# Patient Record
Sex: Female | Born: 1975 | Hispanic: No | Marital: Single | Smoking: Never smoker
Health system: Southern US, Community
[De-identification: ages and names within clinical notes are randomized; demographics above are authoritative.]

## PROBLEM LIST (undated history)

## (undated) DIAGNOSIS — I2699 Other pulmonary embolism without acute cor pulmonale: Secondary | ICD-10-CM

## (undated) DIAGNOSIS — F32A Depression, unspecified: Secondary | ICD-10-CM

## (undated) DIAGNOSIS — F419 Anxiety disorder, unspecified: Secondary | ICD-10-CM

## (undated) DIAGNOSIS — D649 Anemia, unspecified: Secondary | ICD-10-CM

## (undated) DIAGNOSIS — E785 Hyperlipidemia, unspecified: Secondary | ICD-10-CM

## (undated) DIAGNOSIS — F329 Major depressive disorder, single episode, unspecified: Secondary | ICD-10-CM

## (undated) DIAGNOSIS — G43909 Migraine, unspecified, not intractable, without status migrainosus: Secondary | ICD-10-CM

## (undated) HISTORY — PX: DILATION AND CURETTAGE OF UTERUS: SHX78

## (undated) HISTORY — PX: MENISCUS REPAIR: SHX5179

---

## 2014-06-26 ENCOUNTER — Emergency Department (HOSPITAL_BASED_OUTPATIENT_CLINIC_OR_DEPARTMENT_OTHER)
Admission: EM | Admit: 2014-06-26 | Discharge: 2014-06-26 | Disposition: A | Discharge status: Home / Self Care | Payer: Medicaid Other | Attending: Emergency Medicine | Admitting: Emergency Medicine

## 2014-06-26 ENCOUNTER — Encounter (HOSPITAL_BASED_OUTPATIENT_CLINIC_OR_DEPARTMENT_OTHER): Payer: Self-pay

## 2014-06-26 DIAGNOSIS — Z8639 Personal history of other endocrine, nutritional and metabolic disease: Secondary | ICD-10-CM | POA: Insufficient documentation

## 2014-06-26 DIAGNOSIS — O9989 Other specified diseases and conditions complicating pregnancy, childbirth and the puerperium: Secondary | ICD-10-CM | POA: Insufficient documentation

## 2014-06-26 DIAGNOSIS — Z862 Personal history of diseases of the blood and blood-forming organs and certain disorders involving the immune mechanism: Secondary | ICD-10-CM | POA: Diagnosis not present

## 2014-06-26 DIAGNOSIS — R519 Headache, unspecified: Secondary | ICD-10-CM

## 2014-06-26 DIAGNOSIS — Z8679 Personal history of other diseases of the circulatory system: Secondary | ICD-10-CM | POA: Insufficient documentation

## 2014-06-26 DIAGNOSIS — Z8659 Personal history of other mental and behavioral disorders: Secondary | ICD-10-CM | POA: Diagnosis not present

## 2014-06-26 DIAGNOSIS — Z3A17 17 weeks gestation of pregnancy: Secondary | ICD-10-CM | POA: Diagnosis not present

## 2014-06-26 DIAGNOSIS — R51 Headache: Secondary | ICD-10-CM | POA: Insufficient documentation

## 2014-06-26 HISTORY — DX: Hyperlipidemia, unspecified: E78.5

## 2014-06-26 HISTORY — DX: Major depressive disorder, single episode, unspecified: F32.9

## 2014-06-26 HISTORY — DX: Migraine, unspecified, not intractable, without status migrainosus: G43.909

## 2014-06-26 HISTORY — DX: Depression, unspecified: F32.A

## 2014-06-26 HISTORY — DX: Anemia, unspecified: D64.9

## 2014-06-26 HISTORY — DX: Anxiety disorder, unspecified: F41.9

## 2014-06-26 MED ORDER — DIPHENHYDRAMINE HCL 50 MG/ML IJ SOLN
12.5000 mg | Freq: Once | INTRAMUSCULAR | Status: AC
  Administered 2014-06-26: 12.5 mg via INTRAVENOUS
  Filled 2014-06-26: qty 1

## 2014-06-26 MED ORDER — METOCLOPRAMIDE HCL 5 MG/ML IJ SOLN
10.0000 mg | Freq: Once | INTRAMUSCULAR | Status: AC
  Administered 2014-06-26: 10 mg via INTRAVENOUS
  Filled 2014-06-26: qty 2

## 2014-06-26 NOTE — Discharge Instructions (Signed)

## 2014-06-26 NOTE — ED Notes (Signed)
MD at bedside. 

## 2014-06-26 NOTE — ED Provider Notes (Signed)
CSN: [id number]     Arrival date & time 06/26/14  0732 History   First MD Initiated Contact with Patient 06/26/14 [phone]     Chief Complaint  Patient presents with  . Headache   Patient is a 39 y.o. female presenting with headaches.  Headache Pain location:  R parietal and frontal Quality:  Sharp Radiates to:  Does not radiate Onset quality:  Gradual Duration:  4 days Timing:  Constant Chronicity:  Recurrent Context: not exposure to bright light and not loud noise   Relieved by:  Nothing Ineffective treatments: Fioricet. Associated symptoms: no blurred vision, no cough, no diarrhea, no fever, no neck pain, no neck stiffness and no URI    the patient is [redacted] weeks pregnant. She is not having any complications with her pregnancy. She saw her OB doctor. She was given a prescription for Fioricet. She was told by her OB doctor that she may need to see a neurologist. She was told if symptoms did not get any better to go the emergency room. Patient went to [facility] and was waiting to be seen. Due to the long wait time she decided to come here. The pain is sharp and also located in her right ear. She feels like there is some numbness on the right side of her face. She has not had any issues with her strength or speech. She is not having any difficulty walking. No visual disturbances.  Past Medical History  Diagnosis Date  . Hyperlipidemia   . Anemia   . Anxiety   . Depression   . Migraine    History reviewed. No pertinent past surgical history. No family history on file. History  Substance Use Topics  . Smoking status: Never Smoker   . Smokeless tobacco: Not on file  . Alcohol Use: No   OB History    Gravida Para Term Preterm AB TAB SAB Ectopic Multiple Living   1              Review of Systems  Constitutional: Negative for fever.  Eyes: Negative for blurred vision.  Respiratory: Negative for cough.   Gastrointestinal: Negative for diarrhea.  Musculoskeletal:  Negative for neck pain and neck stiffness.  Neurological: Positive for headaches.  All other systems reviewed and are negative.     Allergies  Review of patient's allergies indicates not on file.  Home Medications   Prior to Admission medications   Not on File   BP 112/73 mmHg  Pulse 94  Temp(Src) 98.6 F (37 C) (Oral)  Resp 18  Ht  (1.626 m)  Wt 212 lb (96.163 kg)  BMI 36.37 kg/m2  SpO2 100% Physical Exam  Constitutional: She is oriented to person, place, and time. She appears well-developed and well-nourished. No distress.  HENT:  Head: Macrocephalic. Head is with abrasion.  Right Ear: Tympanic membrane and external ear normal.  Left Ear: Tympanic membrane and external ear normal.  Mouth/Throat: Oropharynx is clear and moist and mucous membranes are normal. No oral lesions. No dental abscesses or uvula swelling. No posterior oropharyngeal edema.  Eyes: Conjunctivae are normal. Right eye exhibits no discharge. Left eye exhibits no discharge. No scleral icterus.  Neck: Neck supple. No tracheal deviation present.  Cardiovascular: Normal rate, regular rhythm and intact distal pulses.   Pulmonary/Chest: Effort normal and breath sounds normal. No stridor. No respiratory distress. She has no wheezes. She has no rales.  Abdominal: Soft. Bowel sounds are normal. She exhibits no distension.  There is no tenderness. There is no rebound and no guarding.  Musculoskeletal: She exhibits no edema or tenderness.  Neurological: She is alert and oriented to person, place, and time. She has normal strength. No cranial nerve deficit (No facial droop, extraocular movements intact, tongue midline ) or sensory deficit. She exhibits normal muscle tone. She displays no seizure activity. Coordination normal.  5 out of 5 strength bilateral upper extremities and lower extremities, sensation intact in all extremities, no visual field cuts, no left or right sided neglect,  no nystagmus noted   Skin:  Skin is warm and dry. No rash noted.  Psychiatric: She has a normal mood and affect.  Nursing note and vitals reviewed.   ED Course  Procedures (including critical care time) Medications  metoCLOPramide (REGLAN) injection 10 mg (10 mg Intravenous Given 06/26/14 0807)  diphenhydrAMINE (BENADRYL) injection 12.5 mg (12.5 mg Intravenous Given 06/26/14 0807)     MDM   Final diagnoses:  Acute intractable headache, unspecified headache type    Patient send her OB doctor thought she might end up needing scan. I have a low suspicion for acute hemorrhage, stroke, or brain tumor based on her findings. I do not feel that a CT scan of the brain is indicated at this time. Patient does have history of migraines. It is possible this is an atypical migraine type headache. We will try a dose of Reglan. I do think it is safe for her to follow up with a neurologist if her symptoms persist  While still in the ED, pt states she got a call from the neurologist and she can be seen in the office by them now.  Will dc home.    Linwood DibblesJon Nikolas Casher, MD 06/26/14 [phone]

## 2014-06-26 NOTE — ED Notes (Signed)
Pt c/o pain in headache since Friday. Pt is [redacted] weeks pregnant. Also reports sharp pain in right ear.

## 2015-12-13 ENCOUNTER — Emergency Department (HOSPITAL_BASED_OUTPATIENT_CLINIC_OR_DEPARTMENT_OTHER)
Admission: EM | Admit: 2015-12-13 | Discharge: 2015-12-13 | Disposition: A | Discharge status: Home / Self Care | Payer: Medicaid Other | Attending: Emergency Medicine | Admitting: Emergency Medicine

## 2015-12-13 ENCOUNTER — Encounter (HOSPITAL_BASED_OUTPATIENT_CLINIC_OR_DEPARTMENT_OTHER): Payer: Self-pay

## 2015-12-13 DIAGNOSIS — Z79899 Other long term (current) drug therapy: Secondary | ICD-10-CM | POA: Insufficient documentation

## 2015-12-13 DIAGNOSIS — R1310 Dysphagia, unspecified: Secondary | ICD-10-CM

## 2015-12-13 DIAGNOSIS — J029 Acute pharyngitis, unspecified: Secondary | ICD-10-CM | POA: Diagnosis present

## 2015-12-13 HISTORY — DX: Other pulmonary embolism without acute cor pulmonale: I26.99

## 2015-12-13 LAB — RAPID STREP SCREEN (MED CTR MEBANE ONLY): STREPTOCOCCUS, GROUP A SCREEN (DIRECT): NEGATIVE

## 2015-12-13 MED ORDER — MAGIC MOUTHWASH: 5.0000 mL | Freq: Three times a day (TID) | ORAL | 0 refills | Status: AC | PRN

## 2015-12-13 MED ORDER — GI COCKTAIL ~~LOC~~
30.0000 mL | Freq: Once | ORAL | Status: AC
  Administered 2015-12-13: 30 mL via ORAL
  Filled 2015-12-13: qty 30

## 2015-12-13 NOTE — ED Triage Notes (Signed)
Reports sore throat and pain with swallowing.  Reports had strep test yesterday which was negative.  Reports "feels like spasms when I swallow or eat anything".  No redness noted or englarged tonsils.

## 2015-12-13 NOTE — ED Provider Notes (Signed)
[facility]   CSN: [id number] Arrival date & time: 12/13/15  1007     History   Chief Complaint Chief Complaint  Patient presents with  . Sore Throat    HPI Lindsey Li is a 40 y.o. female.  The history is provided by the patient. No language interpreter was used.  Sore Throat     Lindsey Li is a 40 y.o. female who presents to the Emergency Department complaining of sore throat.  She reports 2 days of sore throat. Pain is throughout her entire throat but greatest on the right side. She has no associated cramping and muscle spasm type sensation when she tries to swallow solids has been unable to swallow solids. She is able to drink liquids but they cause her pain to worsen. She has multiple sick contacts with strep throat. She had an unofficial strep test performed yesterday that was negative. She denies any fevers, shortness of breath, vomiting. She does have a mild cough.  Past Medical History:  Diagnosis Date  . Anemia   . Anxiety   . Depression   . Hyperlipidemia   . Migraine   . PE (pulmonary embolism)     There are no active problems to display for this patient.   Past Surgical History:  Procedure Laterality Date  . CESAREAN SECTION    . DILATION AND CURETTAGE OF UTERUS    . MENISCUS REPAIR Left     OB History    Gravida Para Term Preterm AB Living   1             SAB TAB Ectopic Multiple Live Births                   Home Medications    Prior to Admission medications   Medication Sig Start Date End Date Taking? Authorizing Provider  Atorvastatin Calcium (LIPITOR PO) Take by mouth.   Yes Historical Provider, MD  BuPROPion HCl (WELLBUTRIN XL PO) Take by mouth.   Yes Historical Provider, MD  LOSARTAN POTASSIUM PO Take by mouth.   Yes Historical Provider, MD  MetyraPONE (METOPIRONE PO) Take by mouth.   Yes Historical Provider, MD  Nutritional Supplements (VITAMIN D BOOSTER PO) Take by mouth.   Yes Historical Provider, MD    Rivaroxaban (XARELTO PO) Take by mouth.   Yes Historical Provider, MD  sertraline (ZOLOFT) 25 MG tablet Take 25 mg by mouth daily.   Yes Historical Provider, MD  magic mouthwash SOLN Take 5 mLs by mouth 3 (three) times daily as needed for mouth pain. 12/13/15   Tilden FossaElizabeth Jalayah Gutridge, MD    Family History No family history on file.  Social History Social History  Substance Use Topics  . Smoking status: Never Smoker  . Smokeless tobacco: Never Used  . Alcohol use No     Allergies   Levaquin [levofloxacin]; Morphine and related; Naproxen; Ultram [tramadol]; and Valium [diazepam]   Review of Systems Review of Systems  All other systems reviewed and are negative.    Physical Exam Updated Vital Signs BP 117/80 (BP Location: Right Arm)   Pulse 76   Temp 98.3 F (36.8 C) (Oral)   Resp 18   Ht 5\' 4"  (1.626 m)   Wt 223 lb (101.2 kg)   LMP  (LMP Unknown)   SpO2 98%   Breastfeeding? Unknown   BMI 38.28 kg/m   Physical Exam  Constitutional: She is oriented to person, place, and time. She appears well-developed and well-nourished.  No distress.  HENT:  Head: Normocephalic and atraumatic.  Right Ear: External ear normal.  Left Ear: External ear normal.  No edema in posterior erythema.  One pinpoint exudative area in right tonsillar pillar with minimal erythema.    Neck: Neck supple.  Cardiovascular: Normal rate and regular rhythm.   No murmur heard. Pulmonary/Chest: Effort normal and breath sounds normal. No stridor. No respiratory distress.  Musculoskeletal: She exhibits no edema or tenderness.  Lymphadenopathy:    She has no cervical adenopathy.  Neurological: She is alert and oriented to person, place, and time.  Skin: Skin is warm and dry.  Psychiatric: She has a normal mood and affect. Her behavior is normal.  Nursing note and vitals reviewed.    ED Treatments / Results  Labs (all labs ordered are listed, but only abnormal results are displayed) Labs Reviewed  RAPID  STREP SCREEN (NOT AT [facility])  CULTURE, GROUP A STREP [facility])    EKG  EKG Interpretation None       Radiology No results found.  Procedures Procedures (including critical care time)  Medications Ordered in ED Medications  gi cocktail (Maalox,Lidocaine,Donnatal) (30 mLs Oral Given 12/13/15 1100)     Initial Impression / Assessment and Plan / ED Course  I have reviewed the triage vital signs and the nursing notes.  Pertinent labs & imaging results that were available during my care of the patient were reviewed by me and considered in my medical decision making (see chart for details).  Clinical Course    Pt here for evaluation of sore throat for two days.  She is well hydrated and nontoxic appearing in the ED with no respiratory distress.  No evidence of obstruction or acute infectious process based on exam.  Discussed with pt importance of PCP/GI follow up, home care, return precautions.    Final Clinical Impressions(s) / ED Diagnoses   Final diagnoses:  Odynophagia    New Prescriptions Discharge Medication List as of 12/13/2015 11:48 AM    START taking these medications   Details  magic mouthwash SOLN Take 5 mLs by mouth 3 (three) times daily as needed for mouth pain., Starting Fri 12/13/2015, Print         Tilden Fossa, MD 12/14/15 1339

## 2015-12-16 LAB — CULTURE, GROUP A STREP (THRC)

## 2020-09-07 ENCOUNTER — Emergency Department (HOSPITAL_BASED_OUTPATIENT_CLINIC_OR_DEPARTMENT_OTHER)
Admission: EM | Admit: 2020-09-07 | Discharge: 2020-09-07 | Disposition: A | Discharge status: Home / Self Care | Payer: Medicaid Other | Attending: Emergency Medicine | Admitting: Emergency Medicine

## 2020-09-07 ENCOUNTER — Emergency Department (HOSPITAL_BASED_OUTPATIENT_CLINIC_OR_DEPARTMENT_OTHER): Payer: Medicaid Other

## 2020-09-07 ENCOUNTER — Other Ambulatory Visit: Payer: Self-pay

## 2020-09-07 ENCOUNTER — Encounter (HOSPITAL_BASED_OUTPATIENT_CLINIC_OR_DEPARTMENT_OTHER): Payer: Self-pay

## 2020-09-07 DIAGNOSIS — S0990XA Unspecified injury of head, initial encounter: Secondary | ICD-10-CM

## 2020-09-07 DIAGNOSIS — W19XXXA Unspecified fall, initial encounter: Secondary | ICD-10-CM

## 2020-09-07 DIAGNOSIS — M25561 Pain in right knee: Secondary | ICD-10-CM | POA: Diagnosis not present

## 2020-09-07 DIAGNOSIS — S6991XA Unspecified injury of right wrist, hand and finger(s), initial encounter: Secondary | ICD-10-CM | POA: Diagnosis present

## 2020-09-07 DIAGNOSIS — M25562 Pain in left knee: Secondary | ICD-10-CM | POA: Diagnosis not present

## 2020-09-07 DIAGNOSIS — S63501A Unspecified sprain of right wrist, initial encounter: Secondary | ICD-10-CM

## 2020-09-07 DIAGNOSIS — M25511 Pain in right shoulder: Secondary | ICD-10-CM

## 2020-09-07 DIAGNOSIS — Y9351 Activity, roller skating (inline) and skateboarding: Secondary | ICD-10-CM | POA: Diagnosis not present

## 2020-09-07 DIAGNOSIS — M25571 Pain in right ankle and joints of right foot: Secondary | ICD-10-CM

## 2020-09-07 MED ORDER — ACETAMINOPHEN 500 MG PO TABS
1000.0000 mg | ORAL_TABLET | Freq: Once | ORAL | Status: AC
  Administered 2020-09-07: 1000 mg via ORAL
  Filled 2020-09-07: qty 2

## 2020-09-07 MED ORDER — METHOCARBAMOL 500 MG PO TABS: 500.0000 mg | ORAL_TABLET | Freq: Two times a day (BID) | ORAL | 0 refills | Status: AC
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
## 2020-09-07 NOTE — ED Notes (Signed)
Patient transported to CT 

## 2020-09-07 NOTE — Discharge Instructions (Addendum)
Your work-up today was reassuring there were no fractures.  If you continue to have knee pain you may follow-up with [facility] for evaluation.  Your CT scan of your head was without any intracranial hemorrhage.  Please take Tylenol and ibuprofen as discussed below.  I also prescribed a muscle relaxer for pain.  Drink plenty of water.  I also written you a work note for the next couple days.  Please use Tylenol or ibuprofen for pain.  You may use 600 mg ibuprofen every 6 hours or 1000 mg of Tylenol every 6 hours.  You may choose to alternate between the 2.  This would be most effective.  Not to exceed 4 g of Tylenol within 24 hours.  Not to exceed 3200 mg ibuprofen 24 hours.
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
## 2021-11-22 IMAGING — DX DG KNEE COMPLETE 4+V*R*
4 series · 4 of 4 positions shown · non-contrast
Comparison: None.

CLINICAL DATA: Right knee pain

EXAM:
RIGHT KNEE - COMPLETE 4+ VIEW

[knee ap]
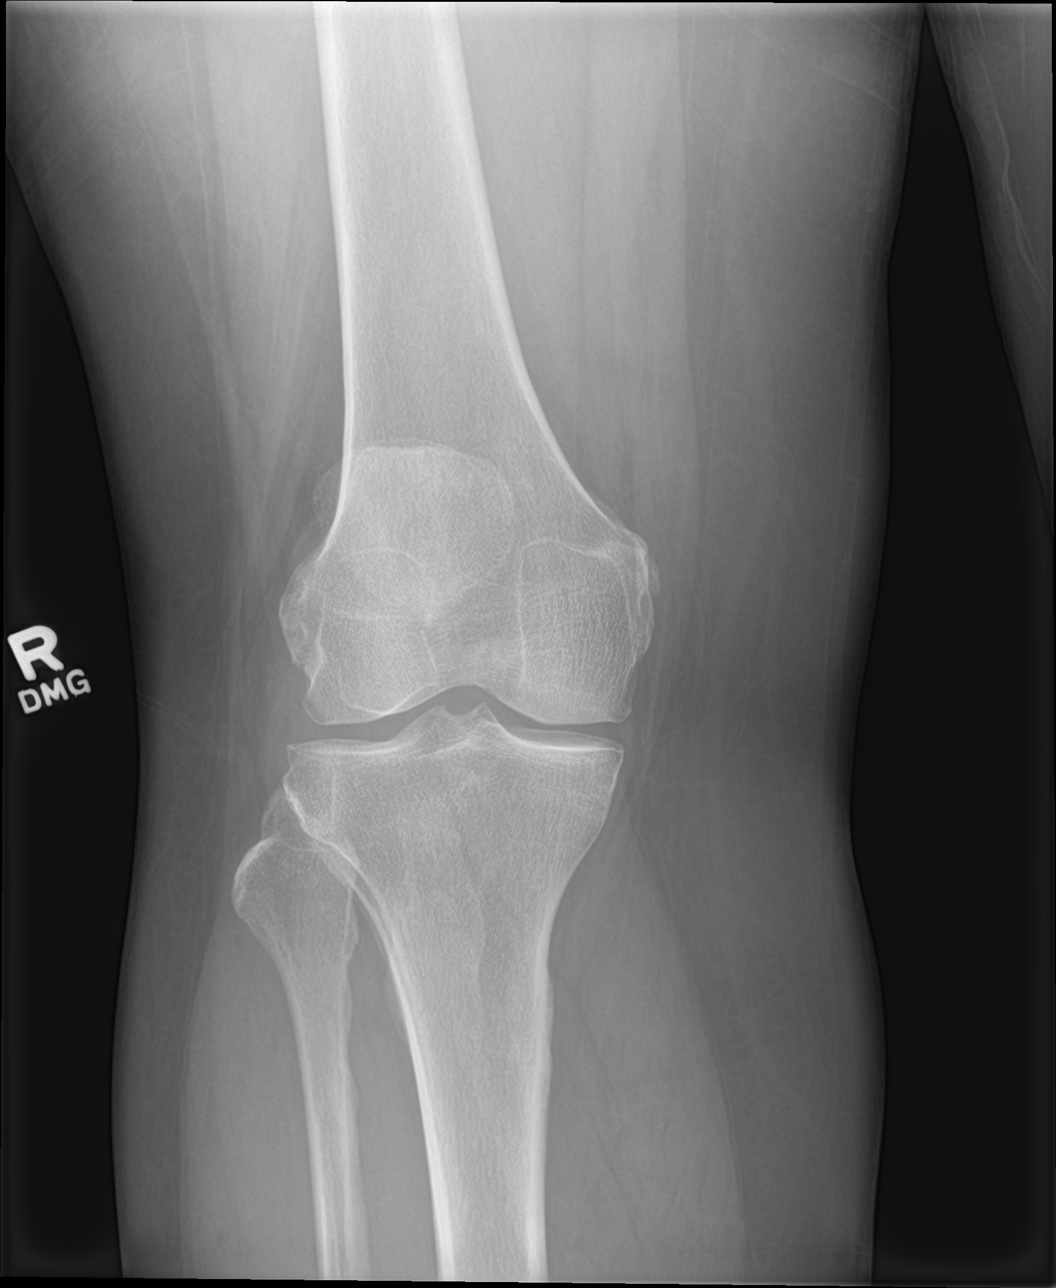

[tunnel]
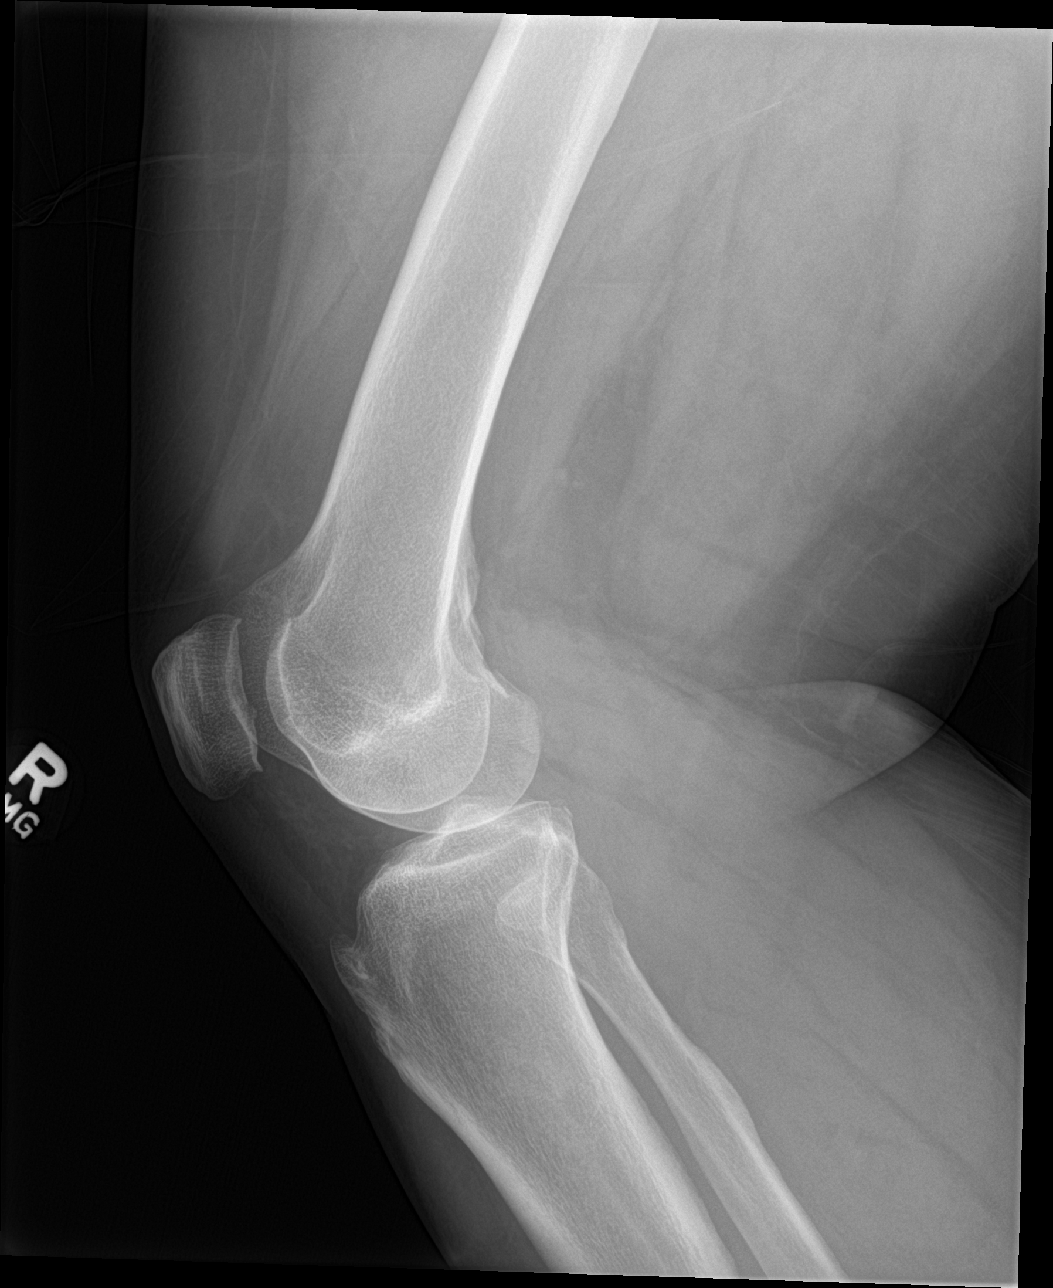

[knee obl (1 of 2)]
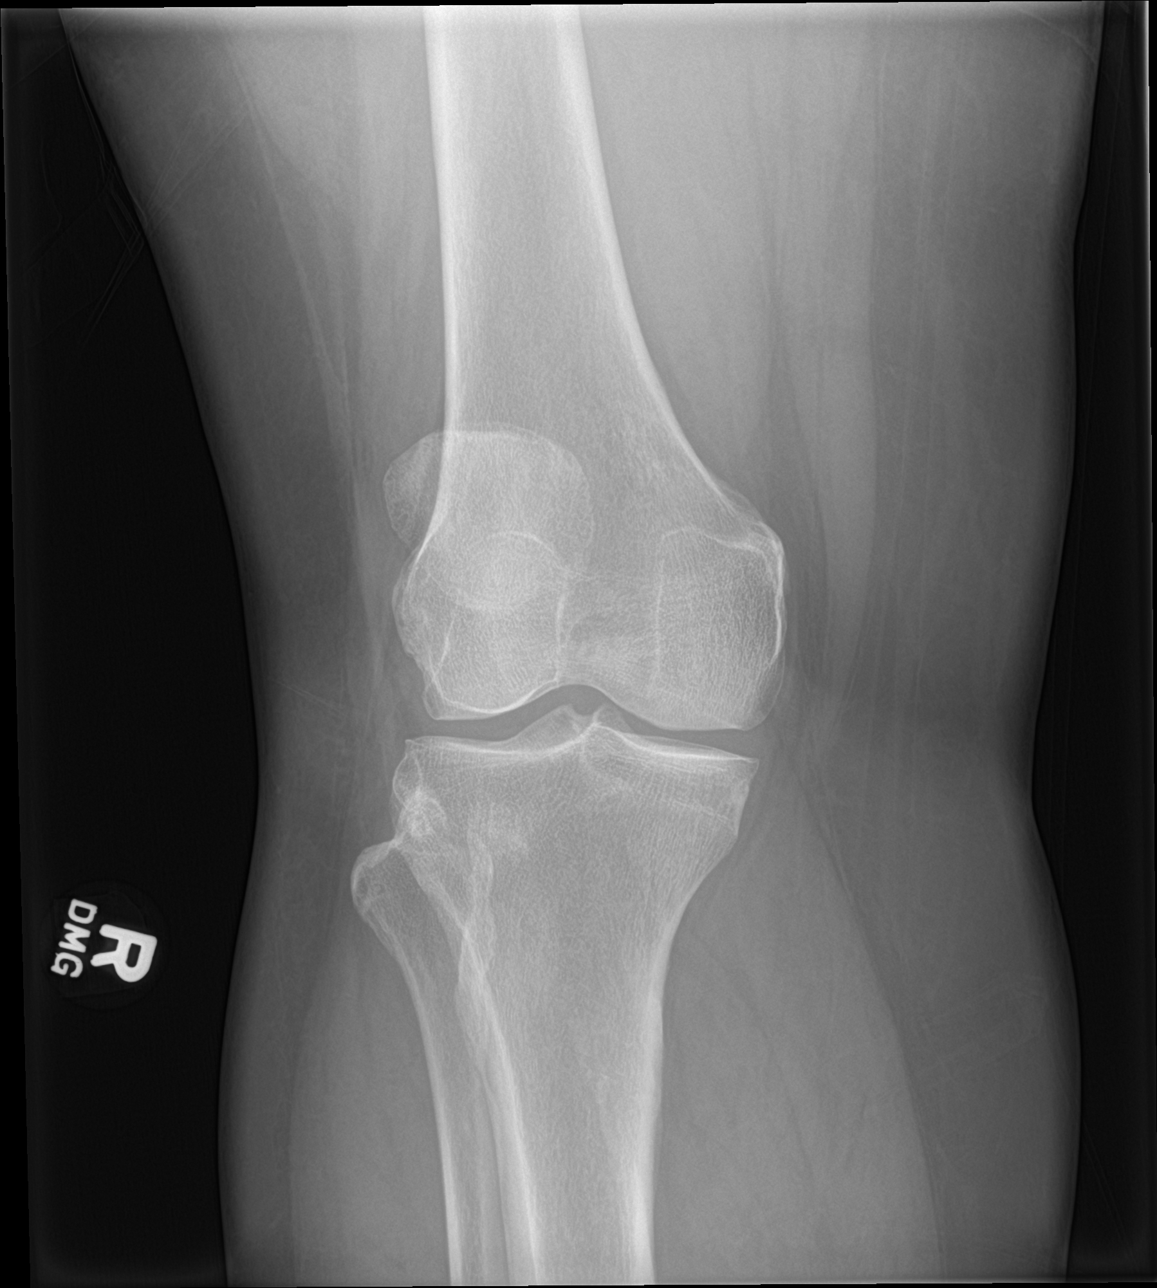

[knee obl (2 of 2)]
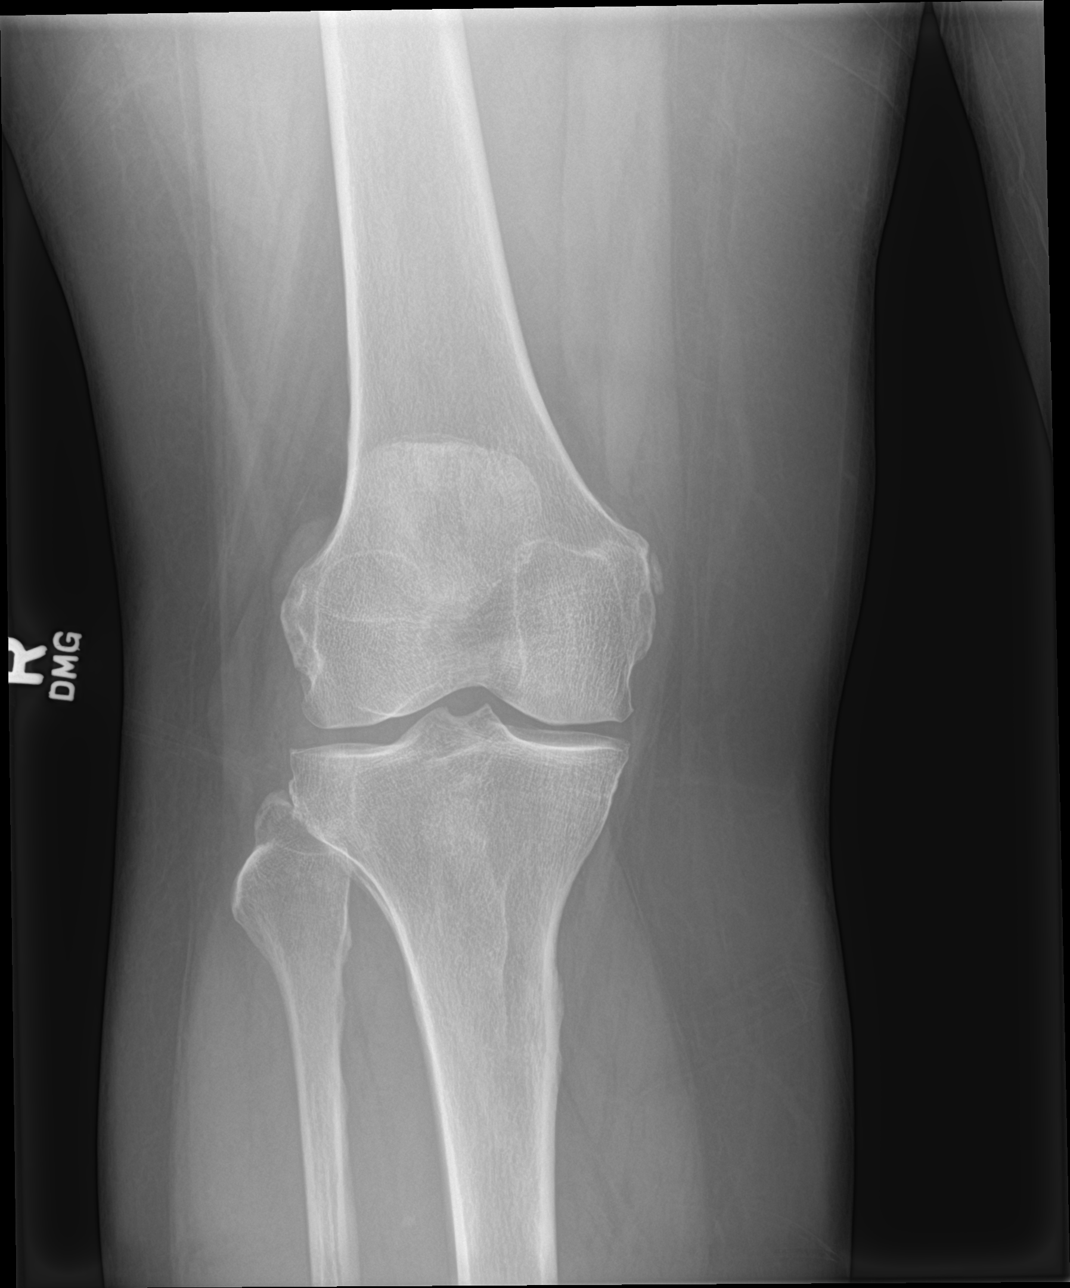

[4 of 4 positions shown; findings below may reference images not displayed]

FINDINGS: No evidence of fracture, dislocation, or joint effusion. Joint
spaces are preserved with tiny marginal osteophytes. Soft tissues
are unremarkable.
IMPRESSION: No acute osseous abnormality, right knee.

## 2023-05-29 ENCOUNTER — Emergency Department (HOSPITAL_BASED_OUTPATIENT_CLINIC_OR_DEPARTMENT_OTHER)
Admission: EM | Admit: 2023-05-29 | Discharge: 2023-05-29 | Disposition: A | Discharge status: Home / Self Care | Attending: Emergency Medicine | Admitting: Emergency Medicine

## 2023-05-29 ENCOUNTER — Encounter (HOSPITAL_BASED_OUTPATIENT_CLINIC_OR_DEPARTMENT_OTHER): Payer: Self-pay | Admitting: Emergency Medicine

## 2023-05-29 ENCOUNTER — Other Ambulatory Visit: Payer: Self-pay

## 2023-05-29 ENCOUNTER — Emergency Department (HOSPITAL_BASED_OUTPATIENT_CLINIC_OR_DEPARTMENT_OTHER)

## 2023-05-29 DIAGNOSIS — R0981 Nasal congestion: Secondary | ICD-10-CM | POA: Diagnosis not present

## 2023-05-29 DIAGNOSIS — R0602 Shortness of breath: Secondary | ICD-10-CM | POA: Diagnosis not present

## 2023-05-29 DIAGNOSIS — R0600 Dyspnea, unspecified: Secondary | ICD-10-CM | POA: Diagnosis not present

## 2023-05-29 DIAGNOSIS — R112 Nausea with vomiting, unspecified: Secondary | ICD-10-CM | POA: Diagnosis present

## 2023-05-29 DIAGNOSIS — R059 Cough, unspecified: Secondary | ICD-10-CM | POA: Diagnosis not present

## 2023-05-29 DIAGNOSIS — Z86711 Personal history of pulmonary embolism: Secondary | ICD-10-CM | POA: Insufficient documentation

## 2023-05-29 DIAGNOSIS — R109 Unspecified abdominal pain: Secondary | ICD-10-CM | POA: Diagnosis not present

## 2023-05-29 DIAGNOSIS — Z7901 Long term (current) use of anticoagulants: Secondary | ICD-10-CM | POA: Insufficient documentation

## 2023-05-29 LAB — CBC
HCT: 42.8 % (ref 36.0–46.0)
Hemoglobin: 13.8 g/dL (ref 12.0–15.0)
MCH: 25.5 pg — ABNORMAL LOW (ref 26.0–34.0)
MCHC: 32.2 g/dL (ref 30.0–36.0)
MCV: 79.1 fL — ABNORMAL LOW (ref 80.0–100.0)
Platelets: 395 10*3/uL (ref 150–400)
RBC: 5.41 MIL/uL — ABNORMAL HIGH (ref 3.87–5.11)
RDW: 15.9 % — ABNORMAL HIGH (ref 11.5–15.5)
WBC: 7.5 10*3/uL (ref 4.0–10.5)
nRBC: 0 % (ref 0.0–0.2)

## 2023-05-29 LAB — COMPREHENSIVE METABOLIC PANEL
ALT: 16 U/L (ref 0–44)
AST: 16 U/L (ref 15–41)
Albumin: 3.8 g/dL (ref 3.5–5.0)
Alkaline Phosphatase: 83 U/L (ref 38–126)
Anion gap: 10 (ref 5–15)
BUN: 11 mg/dL (ref 6–20)
CO2: 23 mmol/L (ref 22–32)
Calcium: 9 mg/dL (ref 8.9–10.3)
Chloride: 101 mmol/L (ref 98–111)
Creatinine, Ser: 1.06 mg/dL — ABNORMAL HIGH (ref 0.44–1.00)
GFR, Estimated: >60 mL/min (ref >60)
Glucose, Bld: 88 mg/dL (ref 70–99)
Potassium: 3.1 mmol/L — ABNORMAL LOW (ref 3.5–5.1)
Sodium: 134 mmol/L — ABNORMAL LOW (ref 135–145)
Total Bilirubin: 0.8 mg/dL (ref 0.0–1.2)
Total Protein: 8.5 g/dL — ABNORMAL HIGH (ref 6.5–8.1)

## 2023-05-29 LAB — URINALYSIS, ROUTINE W REFLEX MICROSCOPIC
Glucose, UA: NEGATIVE mg/dL
Hgb urine dipstick: NEGATIVE
Ketones, ur: 15 mg/dL — AB
Leukocytes,Ua: NEGATIVE
Nitrite: NEGATIVE
Protein, ur: 100 mg/dL — AB
Specific Gravity, Urine: >=1.03 (ref 1.005–1.030)
pH: 6 (ref 5.0–8.0)

## 2023-05-29 LAB — D-DIMER, QUANTITATIVE: D-Dimer, Quant: 0.33 ug{FEU}/mL (ref 0.00–0.50)

## 2023-05-29 LAB — URINALYSIS, MICROSCOPIC (REFLEX)

## 2023-05-29 LAB — MAGNESIUM: Magnesium: 2.2 mg/dL (ref 1.7–2.4)

## 2023-05-29 LAB — RESP PANEL BY RT-PCR (RSV, FLU A&B, COVID)  RVPGX2
Influenza A by PCR: NEGATIVE
Influenza B by PCR: NEGATIVE
Resp Syncytial Virus by PCR: NEGATIVE
SARS Coronavirus 2 by RT PCR: NEGATIVE

## 2023-05-29 LAB — HCG, QUANTITATIVE, PREGNANCY: hCG, Beta Chain, Quant, S: <1 m[IU]/mL (ref <5)

## 2023-05-29 LAB — TROPONIN I (HIGH SENSITIVITY): Troponin I (High Sensitivity): 3 ng/L (ref <18)

## 2023-05-29 LAB — LIPASE, BLOOD: Lipase: 28 U/L (ref 11–51)

## 2023-05-29 MED ORDER — FENTANYL CITRATE PF 50 MCG/ML IJ SOSY
12.5000 ug | PREFILLED_SYRINGE | Freq: Once | INTRAMUSCULAR | Status: AC
  Administered 2023-05-29: 12.5 ug via INTRAVENOUS
  Filled 2023-05-29: qty 1

## 2023-05-29 MED ORDER — PROMETHAZINE HCL 25 MG PO TABS: 25.0000 mg | ORAL_TABLET | Freq: Four times a day (QID) | ORAL | 0 refills | Status: AC | PRN

## 2023-05-29 MED ORDER — DIPHENHYDRAMINE HCL 50 MG/ML IJ SOLN
25.0000 mg | Freq: Once | INTRAMUSCULAR | Status: AC
  Administered 2023-05-29: 25 mg via INTRAVENOUS
  Filled 2023-05-29: qty 1

## 2023-05-29 MED ORDER — ONDANSETRON 4 MG PO TBDP: 4.0000 mg | ORAL_TABLET | Freq: Three times a day (TID) | ORAL | 0 refills | Status: AC | PRN

## 2023-05-29 MED ORDER — ONDANSETRON 4 MG PO TBDP: 4.0000 mg | ORAL_TABLET | Freq: Once | ORAL | Status: DC | PRN

## 2023-05-29 MED ORDER — DROPERIDOL 2.5 MG/ML IJ SOLN
1.2500 mg | Freq: Once | INTRAMUSCULAR | Status: AC
  Administered 2023-05-29: 1.25 mg via INTRAVENOUS
  Filled 2023-05-29: qty 2

## 2023-05-29 MED ORDER — IOHEXOL 300 MG/ML  SOLN
100.0000 mL | Freq: Once | INTRAMUSCULAR | Status: AC | PRN
  Administered 2023-05-29: 100 mL via INTRAVENOUS

## 2023-05-29 MED ORDER — ONDANSETRON HCL 4 MG/2ML IJ SOLN
4.0000 mg | Freq: Once | INTRAMUSCULAR | Status: AC
  Administered 2023-05-29: 4 mg via INTRAVENOUS
  Filled 2023-05-29: qty 2

## 2023-05-29 MED ORDER — ACETAMINOPHEN 500 MG PO TABS
1000.0000 mg | ORAL_TABLET | Freq: Once | ORAL | Status: DC
Start: 2023-05-29 — End: 2023-05-29
  Filled 2023-05-29: qty 2

## 2023-05-29 MED ORDER — POTASSIUM CHLORIDE CRYS ER 20 MEQ PO TBCR
40.0000 meq | EXTENDED_RELEASE_TABLET | Freq: Once | ORAL | Status: AC
  Administered 2023-05-29: 40 meq via ORAL
  Filled 2023-05-29: qty 2

## 2023-05-29 MED ORDER — METOCLOPRAMIDE HCL 5 MG/ML IJ SOLN
10.0000 mg | Freq: Once | INTRAMUSCULAR | Status: AC
  Administered 2023-05-29: 10 mg via INTRAVENOUS
  Filled 2023-05-29: qty 2

## 2023-05-29 MED ORDER — HYDRALAZINE HCL 20 MG/ML IJ SOLN
5.0000 mg | Freq: Once | INTRAMUSCULAR | Status: AC
  Administered 2023-05-29: 5 mg via INTRAVENOUS
  Filled 2023-05-29: qty 1

## 2023-05-29 MED ORDER — PROMETHAZINE HCL 25 MG RE SUPP: 25.0000 mg | Freq: Four times a day (QID) | RECTAL | 0 refills | Status: AC | PRN

## 2023-05-29 MED ORDER — SODIUM CHLORIDE 0.9 % IV BOLUS
1000.0000 mL | Freq: Once | INTRAVENOUS | Status: AC
  Administered 2023-05-29: 1000 mL via INTRAVENOUS
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
## 2023-05-29 NOTE — ED Provider Notes (Signed)
 [place] [facility]MENT AT [facility] Provider Note   CSN: [id number] Arrival date & time: 05/29/23  1345     History {Add pertinent medical, surgical, social history, OB history to HPI:1} Chief Complaint  Patient presents with   Nausea   Shortness of Breath   Palpitations    Lindsey Li is a 48 y.o. female.   Shortness of Breath Palpitations Associated symptoms: shortness of breath     48 year old female presents emergency department with an emergency complaints.  Reports left-sided abdominal pain with nausea and vomiting.  States that she has been having symptoms for the past 4 days.  Also has felt more short of breath ever since symptom onset.  States that she has not had much chest pain but has felt short of breath with walking.  Also states that when she is vomiting, feels short of breath.  States she has had some accompanied nasal congestion as well as cough.  Denies any fevers, chills, urinary symptoms, change in bowel habits.  Denies any hematemesis.  Was seen at the urgent care and prescribed Phenergan without improvement of symptoms prompting visit to the emergency department.  Past medical history significant for pulmonary embolism on Xarelto, CVA, TBI, depression  Home Medications Prior to Admission medications   Medication Sig Start Date End Date Taking? Authorizing Provider  Atorvastatin Calcium (LIPITOR PO) Take by mouth.    [provider]  BuPROPion HCl (WELLBUTRIN XL PO) Take by mouth.    [provider]  LOSARTAN POTASSIUM PO Take by mouth.    [provider]  magic mouthwash SOLN Take 5 mLs by mouth 3 (three) times daily as needed for mouth pain. 12/13/15   Tilden Fossa, MD  methocarbamol (ROBAXIN) 500 MG tablet Take 1 tablet (500 mg total) by mouth 2 (two) times daily. 09/07/20   Gailen Shelter, PA  MetyraPONE (METOPIRONE PO) Take by mouth.    [provider]  Nutritional Supplements (VITAMIN D  BOOSTER PO) Take by mouth.    [provider]  Rivaroxaban (XARELTO PO) Take by mouth.    [provider]  sertraline (ZOLOFT) 25 MG tablet Take 25 mg by mouth daily.    [provider]      Allergies    Levaquin [levofloxacin], Lisinopril, Morphine and codeine, Naproxen, Ultram [tramadol], and Valium [diazepam]    Review of Systems   Review of Systems  Respiratory:  Positive for shortness of breath.   Cardiovascular:  Positive for palpitations.    Physical Exam Updated Vital Signs BP (!) 151/116 (BP Location: Right Arm)   Pulse (!) 123   Temp 98.3 F (36.8 C) (Oral)   Resp 18   Ht 5\' 4"  (1.626 m)   Wt 91.6 kg   SpO2 96%   BMI 34.67 kg/m  Physical Exam Vitals and nursing note reviewed.  Constitutional:      General: She is not in acute distress.    Appearance: She is well-developed.  HENT:     Head: Normocephalic and atraumatic.  Eyes:     Conjunctiva/sclera: Conjunctivae normal.  Cardiovascular:     Rate and Rhythm: Normal rate and regular rhythm.     Heart sounds: No murmur heard. Pulmonary:     Effort: Pulmonary effort is normal. No respiratory distress.     Breath sounds: Normal breath sounds. No wheezing, rhonchi or rales.  Abdominal:     Palpations: Abdomen is soft.     Tenderness: There is abdominal  tenderness.     Comments: Left lower quadrant/left upper abdominal tenderness.  Musculoskeletal:        General: No swelling.     Cervical back: Neck supple.     Right lower leg: No edema.     Left lower leg: No edema.  Skin:    General: Skin is warm and dry.     Capillary Refill: Capillary refill takes less than 2 seconds.  Neurological:     Mental Status: She is alert.  Psychiatric:        Mood and Affect: Mood normal.     ED Results / Procedures / Treatments   Labs (all labs ordered are listed, but only abnormal results are displayed) Labs Reviewed  RESP PANEL BY RT-PCR (RSV, FLU A&B, COVID)  RVPGX2  LIPASE, BLOOD   COMPREHENSIVE METABOLIC PANEL  CBC  URINALYSIS, ROUTINE W REFLEX MICROSCOPIC  MAGNESIUM  HCG, QUANTITATIVE, PREGNANCY    EKG EKG Interpretation Date/Time:  Saturday May 29 2023 13:54:10 EST Ventricular Rate:  118 PR Interval:  152 QRS Duration:  70 QT Interval:  328 QTC Calculation: 460 R Axis:   -49  Text Interpretation: Sinus tachycardia Probable left atrial enlargement Abnormal R-wave progression, early transition Probable left ventricular hypertrophy Anterior Q waves, possibly due to LVH No old tracing to compare Confirmed by Linwood Dibbles [phone]) on 05/29/2023 1:59:16 PM  Radiology No results found.  Procedures Procedures  {Document cardiac monitor, telemetry assessment procedure when appropriate:1}  Medications Ordered in ED Medications  ondansetron (ZOFRAN) injection 4 mg (has no administration in time range)  sodium chloride 0.9 % bolus 1,000 mL (has no administration in time range)    ED Course/ Medical Decision Making/ A&P   {   Click here for ABCD2, HEART and other calculatorsREFRESH Note before signing :1}                              Medical Decision Making Amount and/or Complexity of Data Reviewed Labs: ordered. Radiology: ordered.  Risk OTC drugs. Prescription drug management.   This patient presents to the ED for concern of abdominal pain, shortness of breath, this involves an extensive number of treatment options, and is a complaint that carries with it a high risk of complications and morbidity.  The differential diagnosis includes viral gastroenteritis, gastritis, PUD, cholecystitis, CBD pathology, SBO/LBO, volvulus, diverticulitis, discitis, anemia, ACS, PE, pneumonia, pneumothorax, other   Co morbidities that complicate the patient evaluation  See HPI   Additional history obtained:  Additional history obtained from EMR External records from outside source obtained and reviewed including see above   Lab Tests:  I Ordered, and  personally interpreted labs.  The pertinent results include: No leukocytosis.  No evidence of anemia.  Placed within range.  Mild hyponatremia, hypokalemia 434 3.1 respectively.  Patient with baseline renal function with creatinine 1.06.  No transaminitis.  Troponin negative.  D-dimer negative.  Viral testing negative.  UA with 15 ketones, proteins without evidence of infectious etiology.   Imaging Studies ordered:  I ordered imaging studies including chest x-ray, CT abdomen pelvis I independently visualized and interpreted imaging which showed  Chest x-ray: No acute cardiopulmonary malady. CT abdomen pelvis: No acute abnormality.  Diverticulosis.  Stable complex right renal lesion.  Hepatic steatosis. I agree with the radiologist interpretation  Cardiac Monitoring: / EKG:  The patient was maintained on a cardiac monitor.  I personally viewed and interpreted the cardiac monitored which  showed an underlying rhythm of: Sinus tachycardia.   Consultations Obtained:  I requested consultation with attending Dr. Wallace Cullens who is in agreement treatment plan going forward.   Problem List / ED Course / Critical interventions / Medication management  Left-sided abdominal pain, nausea, vomiting, shortness of breath I ordered medication including fentanyl, Zofran, hydralazine, Reglan, Benadryl, normal saline, droperidol, potassium chloride   Reevaluation of the patient after these medicines showed that the patient improved I have reviewed the patients home medicines and have made adjustments as needed   Social Determinants of Health:  Denies tobacco, licit drug use.   Test / Admission - Considered:  Left-sided abdominal pain, nausea, vomiting, shortness of breath Vitals signs significant for hyper engine. Otherwise within normal range and stable throughout visit. Laboratory/imaging studies significant for: See above 48 year old female presents emergency department with an emergency complaints.   Reports left-sided abdominal pain with nausea and vomiting.  States that she has been having symptoms for the past 4 days.  Also has felt more short of breath ever since symptom onset.  States that she has not had much chest pain but has felt short of breath with walking.  Also states that when she is vomiting, feels short of breath.  States she has had some accompanied nasal congestion as well as cough.  Denies any fevers, chills, urinary symptoms, change in bowel habits.  Denies any hematemesis.  Was seen at the urgent care and prescribed Phenergan without improvement of symptoms prompting visit to the emergency department. *** Worrisome signs and symptoms were discussed with the patient, and the patient acknowledged understanding to return to the ED if noticed. Patient was stable upon discharge.    {Document critical care time when appropriate:1} {Document review of labs and clinical decision tools ie heart score, Chads2Vasc2 etc:1}  {Document your independent review of radiology images, and any outside records:1} {Document your discussion with family members, caretakers, and with consultants:1} {Document social determinants of health affecting pt's care:1} {Document your decision making why or why not admission, treatments were needed:1} Final Clinical Impression(s) / ED Diagnoses Final diagnoses:  None    Rx / DC Orders ED Discharge Orders     None

## 2023-05-29 NOTE — Discharge Instructions (Signed)
 As discussed, your workup today was overall reassuring.  CT scan of your abdomen appeared normal.  Your potassium was slightly low so give a few days worth of potassium.  Also send home with nausea medicine to use as needed.  Continue take your blood pressure medicine patient blood pressure was elevated today.  Recommend follow-up with your primary care for reassessment.  Please not hesitate to return if the worrisome signs and symptoms we discussed become apparent.

## 2023-05-29 NOTE — ED Notes (Signed)
 Ambulatory to BR to obtain urine spec to lab

## 2023-05-29 NOTE — ED Notes (Signed)
 Patient transported to CT

## 2023-05-29 NOTE — ED Triage Notes (Signed)
 Pt POV unsteady gait- c/o emesis, chills, ShOB, palpitations x4 days. Seen at another ED for same, UC for same, received Rx for nausea. Reports Rx are ineffective.   Denies diarrhea. Poor PO intake. Denies fever. Unable to take HTN meds due to nausea.    AOx4. Pt took phenergan at UC, ineffective.

## 2023-05-29 NOTE — ED Notes (Signed)
 Provided pt with saltine crackers and water for PO challenge

## 2023-07-28 ENCOUNTER — Emergency Department (HOSPITAL_BASED_OUTPATIENT_CLINIC_OR_DEPARTMENT_OTHER)
Admission: EM | Admit: 2023-07-28 | Discharge: 2023-07-28 | Disposition: A | Discharge status: Home / Self Care | Attending: Emergency Medicine | Admitting: Emergency Medicine

## 2023-07-28 ENCOUNTER — Other Ambulatory Visit: Payer: Self-pay

## 2023-07-28 ENCOUNTER — Encounter (HOSPITAL_BASED_OUTPATIENT_CLINIC_OR_DEPARTMENT_OTHER): Payer: Self-pay | Admitting: Emergency Medicine

## 2023-07-28 DIAGNOSIS — H5712 Ocular pain, left eye: Secondary | ICD-10-CM | POA: Insufficient documentation

## 2023-07-28 MED ORDER — ERYTHROMYCIN 5 MG/GM OP OINT: TOPICAL_OINTMENT | OPHTHALMIC | 0 refills | Status: AC

## 2023-07-28 MED ORDER — TETRACAINE HCL 0.5 % OP SOLN
2.0000 [drp] | Freq: Once | OPHTHALMIC | Status: AC
  Administered 2023-07-28: 2 [drp] via OPHTHALMIC
  Filled 2023-07-28: qty 4

## 2023-07-28 MED ORDER — FLUORESCEIN SODIUM 1 MG OP STRP
1.0000 | ORAL_STRIP | Freq: Once | OPHTHALMIC | Status: AC
  Administered 2023-07-28: 1 via OPHTHALMIC
  Filled 2023-07-28: qty 1

## 2023-07-28 MED ORDER — ERYTHROMYCIN 5 MG/GM OP OINT: TOPICAL_OINTMENT | OPHTHALMIC | 0 refills | Status: DC

## 2023-07-28 NOTE — ED Triage Notes (Signed)
 Pt c/o LT eye irritation and swelling since yesterday; no injury

## 2023-07-28 NOTE — ED Notes (Signed)
 Pt's left eye swollen, unk injury Rates pain 8/10

## 2023-07-28 NOTE — ED Provider Notes (Signed)
 Emergency Department Provider Note   I have reviewed the triage vital signs and the nursing notes.   HISTORY  Chief Complaint Eye Problem   HPI Lindsey Li is a 48 y.o. female with PMH reviewed presents to the ED with left eye pain.  Denies any injury.  She does not wear contact lenses.  Starting yesterday she had pain and irritation with photophobia.  No vision changes.  No severe headaches.  She initially went to urgent care and was referred here for further evaluation.   Past Medical History:  Diagnosis Date   Anemia    Anxiety    Depression    Hyperlipidemia    Migraine    PE (pulmonary embolism)     Review of Systems  Constitutional: No fever/chills Eyes: No visual changes. Positive left eye pain.  Cardiovascular: Denies chest pain. Respiratory: Denies shortness of breath. Skin: Negative for rash. Neurological: Negative for headaches.  ____________________________________________   PHYSICAL EXAM:  VITAL SIGNS: ED Triage Vitals  Encounter Vitals Group     BP 07/28/23 1912 113/89     Pulse Rate 07/28/23 1912 (!) 106     Resp 07/28/23 1912 14     Temp 07/28/23 1912 (!) 97 F (36.1 C)     Temp src --      SpO2 07/28/23 1912 99 %     Weight 07/28/23 1913 199 lb (90.3 kg)     Height 07/28/23 1913 5\' 4"  (1.626 m)   Constitutional: Alert and oriented. Well appearing and in no acute distress. Eyes: Conjunctivae are normal. PERRL. EOMI. Small corneal abrasion at the 12 o'clock position. IOP 15 left and 12 right.  Head: Atraumatic. Nose: No congestion/rhinnorhea. Mouth/Throat: Mucous membranes are moist.  Neck: No stridor.   Cardiovascular: Good peripheral circulation.  Respiratory: Normal respiratory effort. Gastrointestinal: No distention.  Musculoskeletal: No gross deformities of extremities. Neurologic:  Normal speech and language.  Skin:  Skin is warm, dry and intact. No rash  noted.  ____________________________________________   PROCEDURES  Procedure(s) performed:   Procedures  None  ____________________________________________   INITIAL IMPRESSION / ASSESSMENT AND PLAN / ED COURSE  Pertinent labs & imaging results that were available during my care of the patient were reviewed by me and considered in my medical decision making (see chart for details).   This patient is Presenting for Evaluation of eye pain, which does require a range of treatment options, and is a complaint that involves a high risk of morbidity and mortality.  The Differential Diagnoses include corneal abrasion, acute glaucoma, CRAO, globe injury, etc.  Critical Interventions-    Medications  tetracaine  (PONTOCAINE) 0.5 % ophthalmic solution 2 drop (2 drops Both Eyes Given 07/28/23 1945)  fluorescein  ophthalmic strip 1 strip (1 strip Both Eyes Given 07/28/23 1945)    Reassessment after intervention: eye pain resolved with tetracaine .   Medical Decision Making: Summary:  The patient presents emergency department with left eye pain.  Clinically seems most consistent with corneal abrasion.  She does not wear contact lenses.  Intraocular pressure normal bilaterally.  Plan for erythromycin  ointment and ophthalmology follow-up.   Patient's presentation is most consistent with acute, uncomplicated illness.   Disposition: discharge  ____________________________________________  FINAL CLINICAL IMPRESSION(S) / ED DIAGNOSES  Final diagnoses:  Left eye pain    Note:  This document was prepared using Dragon voice recognition software and may include unintentional dictation errors.  Abby Hocking, MD, [facility] Emergency Medicine    Traycen Goyer, Shereen Dike, MD 08/01/23 Emelie Hanger

## 2023-07-28 NOTE — Discharge Instructions (Signed)
 You were seen in the emerged from today with left eye pain.  I suspect there is a small scrape to the surface of your eye.  Please use the antibiotic ointment as prescribed.  Would like for you to call the eye doctor for follow-up.

## 2023-07-31 LAB — AMB RESULTS CONSOLE CBG: Glucose: 92

## 2023-07-31 NOTE — Progress Notes (Signed)
 Resources given for food. SDOH, BP, and  CBG tests given.

## 2023-11-02 NOTE — ED Provider Notes (Signed)
 [facility] Emergency Department Emergency Department Provider Note  This document was created using the aid of voice recognition Dragon dictation software.   Provider at bedside: 11/03/2023 9:12 AM  History obtained from the: Patient  History   Chief Complaint  Patient presents with  . Arm Pain  . Weakness - Generalized     History provided by:  Patient Language interpreter used: No     Lindsey Li is a 48 y.o. female who presents to the ED with multiple complaints.  Patient states that she gave blood work last week for cortisol test and shortly thereafter developed pain and swelling in the left forearm at the site of the blood draw.  Several days later, just had onset of headache that feels consistent with her prior migraines associated with nausea, vomiting, myalgias and malaise.  She also reports having cough and congestion.  There is subjective fever and chills but no documented fever.  She denies chest pain, shortness of breath, abdominal pain and no other reported complaints.  ______________________ ROS: Pertinent positives and negatives per HPI.  Physical Exam   Vitals:   11/02/23 0800 11/02/23 0947 11/02/23 1114 11/02/23 1200  BP: 96/83  105/75 100/74  BP Location:   Right arm   Patient Position:   Lying   Pulse: 93 92 90 72  Resp: 15 16 18 18   Temp:      SpO2: 97% 94% 96% 96%  Weight:      Height:        Physical Exam Vitals and nursing note reviewed.  Constitutional:      General: She is not in acute distress.    Appearance: Normal appearance.  HENT:     Head: Normocephalic and atraumatic.   Eyes:     Pupils: Pupils are equal, round, and reactive to light.    Cardiovascular:     Rate and Rhythm: Normal rate and regular rhythm.  Pulmonary:     Effort: Pulmonary effort is normal.     Breath sounds: Normal breath sounds.  Abdominal:     General: There is no distension.     Palpations: Abdomen is soft.     Tenderness: There is  no abdominal tenderness.   Musculoskeletal:        General: Normal range of motion.     Cervical back: Normal range of motion.   Skin:    General: Skin is warm and dry.     Comments: Area of bruising to the left [facility] with underlying induration, mild overlying redness.  No streaking up the arm, no obvious abscess.  No swelling or asymmetry of the upper extremities bilaterally. No rashes or vesicles noted to the abdomen   Neurological:     Mental Status: She is alert and oriented to person, place, and time.   Psychiatric:        Mood and Affect: Mood normal.     Results    ED Course     Medical Decision Making    DDX:  Anemia, metabolic disorder, electrolyte imbalance, medication adverse effect, medication side effect, ACS, infection, sepsis, endocrine disorder, dehydration, CVA     Clinical Complexity  Patient's presentation is most consistent with acute presentation with potential threat to life or bodily function.  Provider time spent in patient care today, inclusive of but not limited to clinical reassessment, review of diagnostic studies, and discharge preparation, was greater than 30 minutes.   All Imaging and lab work personally viewed and interpreted  by myself. My interpretations are as follow:  Patient presenting to the ED for evaluation of symptoms as above.  She is well-appearing.  Vital signs reassuring.  No meningeal signs.  As her headache feels consistent with her prior migraines and no neurologic deficits, imaging of the brain considered but deferred for now. Record review shows her last CT head was 1 month ago and unremarkable. Patient given migraine cocktail with some symptomatic improvement.  Did offer additional medication which she declined and felt ready for discharge.  CBC shows no leukocytosis.  CMP relatively unremarkable.  COVID, flu and RSV negative.  The area to her left upper extremity appears consistent with a superficial thrombophlebitis.   Point-of-care ultrasound was performed which did not show any underlying abscess or large fluid collection.  Do not suspect DVT given no swelling or asymmetry of the upper extremities and her area pain is very well localized to the location of her recent blood draw (patient also already on Xarelto).  Patient advised to use warm compresses at home.  She is stable for discharge.  Discussed symptomatic management and strict ED return precautions  Medical Decision Making Problems Addressed: Nonintractable headache, unspecified chronicity pattern, unspecified headache type: complicated acute illness or injury Superficial thrombophlebitis of left upper extremity: complicated acute illness or injury  Amount and/or Complexity of Data Reviewed Labs: ordered. Radiology: ordered. ECG/medicine tests: ordered.  Risk OTC drugs. Prescription drug management.    ED Clinical Impression   1. Superficial thrombophlebitis of left upper extremity   2. Nonintractable headache, unspecified chronicity pattern, unspecified headache type    FOLLOW UP Eva Sharolyn Sniff, DO [address] [place] [address] [phone]  In 1 week   [facility] -  EMERGENCY DEPARTMENT [address] [place]  [address] [phone]  If symptoms worsen   ED Disposition     ED Disposition  Discharge   Condition  Stable   Comment  --        _____________________________

## 2023-11-06 ENCOUNTER — Emergency Department (HOSPITAL_COMMUNITY)
Admission: EM | Admit: 2023-11-06 | Discharge: 2023-11-07 | Disposition: A | Discharge status: Home / Self Care | Attending: Emergency Medicine | Admitting: Emergency Medicine

## 2023-11-06 ENCOUNTER — Encounter (HOSPITAL_COMMUNITY): Payer: Self-pay | Admitting: *Deleted

## 2023-11-06 ENCOUNTER — Other Ambulatory Visit: Payer: Self-pay

## 2023-11-06 DIAGNOSIS — Z7901 Long term (current) use of anticoagulants: Secondary | ICD-10-CM | POA: Diagnosis not present

## 2023-11-06 DIAGNOSIS — L03114 Cellulitis of left upper limb: Secondary | ICD-10-CM | POA: Insufficient documentation

## 2023-11-06 DIAGNOSIS — R2232 Localized swelling, mass and lump, left upper limb: Secondary | ICD-10-CM | POA: Diagnosis present

## 2023-11-06 LAB — CBC
HCT: 42.4 % (ref 36.0–46.0)
Hemoglobin: 13.4 g/dL (ref 12.0–15.0)
MCH: 26.5 pg (ref 26.0–34.0)
MCHC: 31.6 g/dL (ref 30.0–36.0)
MCV: 84 fL (ref 80.0–100.0)
Platelets: 324 K/uL (ref 150–400)
RBC: 5.05 MIL/uL (ref 3.87–5.11)
RDW: 15.9 % — ABNORMAL HIGH (ref 11.5–15.5)
WBC: 7.4 K/uL (ref 4.0–10.5)
nRBC: 0 % (ref 0.0–0.2)

## 2023-11-06 LAB — TROPONIN I (HIGH SENSITIVITY): Troponin I (High Sensitivity): 5 ng/L (ref <18)

## 2023-11-06 LAB — BASIC METABOLIC PANEL WITH GFR
Anion gap: 11 (ref 5–15)
BUN: 7 mg/dL (ref 6–20)
CO2: 23 mmol/L (ref 22–32)
Calcium: 8.9 mg/dL (ref 8.9–10.3)
Chloride: 103 mmol/L (ref 98–111)
Creatinine, Ser: 0.91 mg/dL (ref 0.44–1.00)
GFR, Estimated: >60 mL/min (ref >60)
Glucose, Bld: 80 mg/dL (ref 70–99)
Potassium: 3.8 mmol/L (ref 3.5–5.1)
Sodium: 137 mmol/L (ref 135–145)

## 2023-11-06 LAB — HCG, SERUM, QUALITATIVE: Preg, Serum: NEGATIVE

## 2023-11-06 MED ORDER — ACETAMINOPHEN 325 MG PO TABS
ORAL_TABLET | ORAL | Status: AC
  Filled 2023-11-06: qty 1

## 2023-11-06 MED ORDER — ACETAMINOPHEN 325 MG PO TABS
650.0000 mg | ORAL_TABLET | Freq: Once | ORAL | Status: AC
  Administered 2023-11-06: 650 mg via ORAL

## 2023-11-06 NOTE — ED Triage Notes (Signed)
 The pt had a redness  in her lt a-c that has been there for one one week  she was told a week ago that she has plebitis in that arm     chills intermittent no temp  lmp tubal ligation  none

## 2023-11-06 NOTE — ED Triage Notes (Signed)
 Pt to the ED with reports of chest pain/discomfort that began approximately 45 minutes ago. Pt also endorses left arm pain radiating into left armpit and back. Pt reports shortness of breath at rest that developed on her way to the hospital and feeling as though I'm about to pass out. Pt was coming for evaluation of left arm pain reporting being diagnosed with phlebitis by [facility]. Pt endorses history of type 2 N-Stemi. Pt taken for EKG and lab work.

## 2023-11-07 ENCOUNTER — Emergency Department (HOSPITAL_COMMUNITY)

## 2023-11-07 DIAGNOSIS — L03114 Cellulitis of left upper limb: Secondary | ICD-10-CM | POA: Diagnosis not present

## 2023-11-07 LAB — TROPONIN I (HIGH SENSITIVITY): Troponin I (High Sensitivity): 5 ng/L (ref <18)

## 2023-11-07 MED ORDER — HYDROCODONE-ACETAMINOPHEN 5-325 MG PO TABS
1.0000 | ORAL_TABLET | Freq: Once | ORAL | Status: AC
  Administered 2023-11-07: 1 via ORAL
  Filled 2023-11-07: qty 1

## 2023-11-07 MED ORDER — DOXYCYCLINE HYCLATE 100 MG PO CAPS: 100.0000 mg | ORAL_CAPSULE | Freq: Two times a day (BID) | ORAL | 0 refills | Status: AC

## 2023-11-07 MED ORDER — IOHEXOL 350 MG/ML SOLN
75.0000 mL | Freq: Once | INTRAVENOUS | Status: AC | PRN
  Administered 2023-11-07: 75 mL via INTRAVENOUS

## 2023-11-07 NOTE — ED Notes (Signed)
 Unable to start peripheral IV access despite multiple attempts . IV team consult ordered .

## 2023-11-07 NOTE — ED Notes (Signed)
 IV team consult reordered , IV infiltrated at CT scan . PA  notified .

## 2023-11-07 NOTE — ED Notes (Signed)
 IV team RNs at bedside .

## 2023-11-07 NOTE — ED Notes (Signed)
IV team nurse at bedside. 

## 2023-11-07 NOTE — ED Provider Notes (Signed)
 [place] [facility]MENT AT [facility] Provider Note   CSN: [id number] Arrival date & time: 11/06/23  2009     Patient presents with: Arm Pain   Lindsey Li is a 48 y.o. female with history of anemia, anxiety, depression, migraines, pulmonary embolism on Xarelto.  Patient presents to ED complaining of left forearm AC swelling which she reports began after receiving IV infusion about a week ago.  She was seen on 8/12 at outside ED and diagnosed with superficial thrombophlebitis.  Patient discharged stable condition.  Advised to apply warm compresses.  States that over the course of the last few days the swelling has worsened and now she has pain throughout her entire arm.  She also reports that around 1219 this afternoon she developed chest pain and shortness of breath.  She reports that she has a history of PE but reports compliance on Xarelto.  She denies any fevers, nausea or vomiting at home.  Denies any lightheadedness, dizziness, weakness, leg swelling.  She initially presented to [facility] earlier this afternoon but left due to wait time.   Arm Pain       Prior to Admission medications   Medication Sig Start Date End Date Taking? Authorizing Provider  doxycycline  (VIBRAMYCIN ) 100 MG capsule Take 1 capsule (100 mg total) by mouth 2 (two) times daily. 11/07/23  Yes Ruthell Lonni FALCON, PA-C  Atorvastatin Calcium (LIPITOR PO) Take by mouth.    [provider]  BuPROPion HCl (WELLBUTRIN XL PO) Take by mouth.    [provider]  erythromycin  ophthalmic ointment Place a 1/2 inch ribbon of ointment into the left lower eyelid 4 times daily for 7 days. 07/28/23   Long, Joshua G, MD  LOSARTAN POTASSIUM PO Take by mouth.    [provider]  magic mouthwash SOLN Take 5 mLs by mouth 3 (three) times daily as needed for mouth pain. 12/13/15   Griselda Norris, MD  methocarbamol  (ROBAXIN ) 500 MG tablet Take 1 tablet (500 mg total) by mouth 2 (two)  times daily. 09/07/20   Neldon Hamp RAMAN, PA  MetyraPONE (METOPIRONE PO) Take by mouth.    [provider]  Nutritional Supplements (VITAMIN D BOOSTER PO) Take by mouth.    [provider]  ondansetron  (ZOFRAN -ODT) 4 MG disintegrating tablet Take 1 tablet (4 mg total) by mouth every 8 (eight) hours as needed. 05/29/23   Silver Wonda LABOR, PA  promethazine  (PHENERGAN ) 25 MG suppository Place 1 suppository (25 mg total) rectally every 6 (six) hours as needed for nausea or vomiting. 05/29/23   Silver Wonda LABOR, PA  promethazine  (PHENERGAN ) 25 MG tablet Take 1 tablet (25 mg total) by mouth every 6 (six) hours as needed for nausea or vomiting. 05/29/23   Silver Wonda LABOR, PA  Rivaroxaban (XARELTO PO) Take by mouth.    [provider]  sertraline (ZOLOFT) 25 MG tablet Take 25 mg by mouth daily.    [provider]    Allergies: Levaquin [levofloxacin], Lisinopril, Morphine and codeine, Naproxen, Ultram [tramadol], and Valium [diazepam]    Review of Systems  All other systems reviewed and are negative.   Updated Vital Signs BP 126/82 (BP Location: Left Arm)   Pulse 92   Temp 98.1 F (36.7 C) (Temporal)   Resp 16   Ht 5' 4 (1.626 m)   Wt 90.3 kg   SpO2 100%   BMI 34.17 kg/m   Physical Exam Vitals and nursing note reviewed.  Constitutional:  General: She is not in acute distress.    Appearance: She is well-developed.  HENT:     Head: Normocephalic and atraumatic.  Eyes:     Conjunctiva/sclera: Conjunctivae normal.  Cardiovascular:     Rate and Rhythm: Normal rate and regular rhythm.     Heart sounds: No murmur heard. Pulmonary:     Effort: Pulmonary effort is normal. No respiratory distress.     Breath sounds: Normal breath sounds.  Abdominal:     Palpations: Abdomen is soft.     Tenderness: There is no abdominal tenderness.  Musculoskeletal:        General: No swelling.     Cervical back: Neck supple.  Skin:    General: Skin is warm and  dry.     Capillary Refill: Capillary refill takes less than 2 seconds.     Comments: Area of erythema, swelling without fluctuance or induration to left AC.  Please see photo.  Neurological:     Mental Status: She is alert and oriented to person, place, and time. Mental status is at baseline.  Psychiatric:        Mood and Affect: Mood normal.     (all labs ordered are listed, but only abnormal results are displayed) Labs Reviewed  CBC - Abnormal; Notable for the following components:      Result Value   RDW 15.9 (*)    All other components within normal limits  BASIC METABOLIC PANEL WITH GFR  HCG, SERUM, QUALITATIVE  TROPONIN I (HIGH SENSITIVITY)  TROPONIN I (HIGH SENSITIVITY)    EKG: EKG Interpretation Date/Time:  Saturday November 06 2023 20:30:29 EDT Ventricular Rate:  90 PR Interval:  156 QRS Duration:  70 QT Interval:  366 QTC Calculation: 447 R Axis:   -38  Text Interpretation: Normal sinus rhythm Left axis deviation Minimal voltage criteria for LVH, may be normal variant ( R in aVL ) Inferior infarct , age undetermined Anterolateral infarct , age undetermined Abnormal ECG Confirmed by Midge Golas ([id number]) on 11/07/2023 1:56:55 AM  Radiology: CT Angio Chest PE W and/or Wo Contrast Result Date: 11/07/2023 EXAM: CTA of the Chest with contrast for PE 11/07/2023 05:20:37 AM TECHNIQUE: CTA of the chest was performed after the administration of intravenous contrast. Multiplanar reformatted images are provided for review. MIP images are provided for review. Automated exposure control, iterative reconstruction, and/or weight based adjustment of the mA/kV was utilized to reduce the radiation dose to as low as reasonably achievable. COMPARISON: None available. CLINICAL HISTORY: Pulmonary embolism (PE) suspected, low to intermediate prob, neg D-dimer. Cp, left arm pain FINDINGS: PULMONARY ARTERIES: Pulmonary arteries are adequately opacified for evaluation. No pulmonary embolism. Main  pulmonary artery is normal in caliber. MEDIASTINUM: The heart and pericardium demonstrate no acute abnormality. There is no acute abnormality of the thoracic aorta. LYMPH NODES: No mediastinal, hilar or axillary lymphadenopathy. LUNGS AND PLEURA: Mild subsegmental atelectasis and ground glass attenuation noted within the dependent portions of the lower lung zones. 2 mm nodule within the inferior right middle lobe, image 90/6. 2 mm nodule within the anterior medial right upper lobe is also noted, image 59/6. No pleural effusion or pneumothorax. UPPER ABDOMEN: Postoperative changes involving the stomach. SOFT TISSUES AND BONES: Endplate degenerative changes noted within the thoracic spine. No acute bone or soft tissue abnormality. IMPRESSION: 1. No pulmonary embolism. 2. Mild subsegmental atelectasis and ground glass attenuation in the dependent lower lung zones. 3. 2 mm nodules in the inferior right middle lobe and anterior medial  right upper lobe.In a patient who was at low risk, no further follow-up is indicated. If the patient is at increased risk a follow-up CT of the chest without contrast material in 12 months may be considered. Electronically signed by: Waddell Calk MD 11/07/2023 05:42 AM EDT RP Workstation: [id number]     Procedures   Medications Ordered in the ED  acetaminophen  (TYLENOL ) tablet 650 mg (650 mg Oral Given 11/06/23 2204)  HYDROcodone -acetaminophen  (NORCO/VICODIN) 5-325 MG per tablet 1 tablet (1 tablet Oral Given 11/07/23 0244)  iohexol  (OMNIPAQUE ) 350 MG/ML injection 75 mL (75 mLs Intravenous Contrast Given 11/07/23 0522)    Clinical Course as of 11/07/23 0557  Sun Nov 07, 2023  0415 No fluid collection on ultrasound imaging.  Does show slight cobblestoning.  Will treat patient with doxycycline . [CG]    Clinical Course User Index [CG] Ruthell Lonni FALCON, PA-C   Medical Decision Making Amount and/or Complexity of Data Reviewed Radiology: ordered.  Risk Prescription drug  management.   This is a 48 year old female with history of PE on anticoagulation presenting to the ED out of concern of swelling and pain to left forearm as well as chest pain and shortness of breath.  Patient was seen 1 week ago and diagnosed with superficial thrombophlebitis.  Reports that she developed chest pain and shortness of breath earlier today which caused concern.  On arrival, patient is afebrile and nontachycardic.  Her lung sounds are clear bilaterally, no hypoxia.  Abdomen soft and compressible.  Neuroexam at baseline.  Overall patient is nontoxic in appearance with reassuring vital signs.  She does have an area of swelling, erythema to left AC without fluctuance or induration.  Ultrasound imaging utilized which shows no evidence of fluid collection however there is cobblestoning concerning for cellulitis.  Patient workup initiated in triage include CBC, BMP, troponin x 2, EKG. I have added on CTA due to patient having chest pain, shortness of breath with a history of PE.  Patient CBC without leukocytosis or anemia.  Metabolic panel grossly unremarkable.  Troponins are flat.  EKG is nonischemic.  CTA negative for PE.  At this time, patient be treated with doxycycline  for possible cellulitis.  Advised to follow-up with PCP.  Advised to take Tylenol  for pain.  Discharged in stable condition.    Final diagnoses:  Left arm cellulitis    ED Discharge Orders          Ordered    doxycycline  (VIBRAMYCIN ) 100 MG capsule  2 times daily        11/07/23 0556               Ruthell Lonni FALCON, PA-C 11/07/23 0557    Midge Golas, MD 11/07/23 [phone]

## 2023-11-07 NOTE — Discharge Instructions (Addendum)
 It was a pleasure taking part in your care.  As we discussed, your work appears shows no evidence of pulmonary embolism.  I believe you have cellulitis in your left arm.  Please begin taking doxycycline  twice a day for 7 days.  Please follow-up with your PCP.  Return to the ED with any new symptoms.
